# Patient Record
Sex: Female | Born: 1970 | Race: Black or African American | Hispanic: No
Health system: Southern US, Community
[De-identification: ages and names within clinical notes are randomized; demographics above are authoritative.]

---

## 2008-06-23 ENCOUNTER — Emergency Department: Payer: Self-pay | Admitting: Emergency Medicine

## 2011-05-08 ENCOUNTER — Ambulatory Visit: Payer: Self-pay | Admitting: Family Medicine

## 2011-06-10 ENCOUNTER — Emergency Department: Payer: Self-pay | Admitting: Emergency Medicine

## 2011-08-13 ENCOUNTER — Ambulatory Visit: Payer: Self-pay | Admitting: Family Medicine

## 2011-08-14 ENCOUNTER — Ambulatory Visit: Payer: Self-pay | Admitting: Family Medicine

## 2011-10-02 ENCOUNTER — Ambulatory Visit: Payer: Self-pay | Admitting: Family Medicine

## 2011-11-26 ENCOUNTER — Ambulatory Visit: Payer: Self-pay | Admitting: Gastroenterology

## 2011-11-27 LAB — PATHOLOGY REPORT

## 2011-12-19 ENCOUNTER — Inpatient Hospital Stay: Payer: Self-pay | Admitting: Psychiatry

## 2011-12-19 LAB — CBC
HCT: 40.6 % (ref 35.0–47.0)
HGB: 13.7 g/dL (ref 12.0–16.0)
MCH: 32.4 pg (ref 26.0–34.0)
MCHC: 33.7 g/dL (ref 32.0–36.0)
MCV: 96 fL (ref 80–100)
RBC: 4.22 10*6/uL (ref 3.80–5.20)
RDW: 12.8 % (ref 11.5–14.5)
WBC: 3.8 10*3/uL (ref 3.6–11.0)

## 2011-12-19 LAB — COMPREHENSIVE METABOLIC PANEL
Albumin: 3.4 g/dL (ref 3.4–5.0)
Alkaline Phosphatase: 61 U/L (ref 50–136)
BUN: 14 mg/dL (ref 7–18)
Bilirubin,Total: 0.4 mg/dL (ref 0.2–1.0)
Calcium, Total: 8.5 mg/dL (ref 8.5–10.1)
Chloride: 102 mmol/L (ref 98–107)
Co2: 28 mmol/L (ref 21–32)
Creatinine: 0.79 mg/dL (ref 0.60–1.30)
EGFR (Non-African Amer.): >60
Glucose: 140 mg/dL — ABNORMAL HIGH (ref 65–99)
Potassium: 4 mmol/L (ref 3.5–5.1)
SGPT (ALT): 17 U/L

## 2011-12-19 LAB — URINALYSIS, COMPLETE
Bacteria: NONE SEEN
Bilirubin,UR: NEGATIVE
Glucose,UR: NEGATIVE mg/dL (ref 0–75)
Ketone: NEGATIVE
Leukocyte Esterase: NEGATIVE
Nitrite: NEGATIVE
Ph: 6 (ref 4.5–8.0)
RBC,UR: 6 /HPF (ref 0–5)
Squamous Epithelial: 2
WBC UR: 1 /HPF (ref 0–5)

## 2011-12-19 LAB — DRUG SCREEN, URINE
Amphetamines, Ur Screen: NEGATIVE (ref ?–1000)
Barbiturates, Ur Screen: NEGATIVE (ref ?–200)
Benzodiazepine, Ur Scrn: NEGATIVE (ref ?–200)
Methadone, Ur Screen: NEGATIVE (ref ?–300)
Opiate, Ur Screen: POSITIVE (ref ?–300)
Phencyclidine (PCP) Ur S: NEGATIVE (ref ?–25)
Tricyclic, Ur Screen: NEGATIVE (ref ?–1000)

## 2011-12-19 LAB — MAGNESIUM: Magnesium: 1.8 mg/dL

## 2011-12-19 LAB — PREGNANCY, URINE: Pregnancy Test, Urine: NEGATIVE m[IU]/mL

## 2011-12-19 LAB — LIPASE, BLOOD: Lipase: 217 U/L (ref 73–393)

## 2013-06-03 IMAGING — CT CT HEAD WITHOUT CONTRAST
1 series · 16 of 29 positions shown, 20 images · non-contrast
Comparison: none

REASON FOR EXAM: CR  new onset of severe headache
COMMENTS:

PROCEDURE:     KCT - KCT HEAD WITHOUT CONTRAST  - May 08, 2011  [DATE]
RESULT:     Comparison:  None
TECHNIQUE: Multiple axial images from the foramen magnum to the vertex were
obtained without IV contrast.

[Series 2: 1 soft tissue · axial · 0.43mm/px · z∈[-150,-20]mm · 16 of 29 slices shown, 20 images]
[im 2/29  brain]
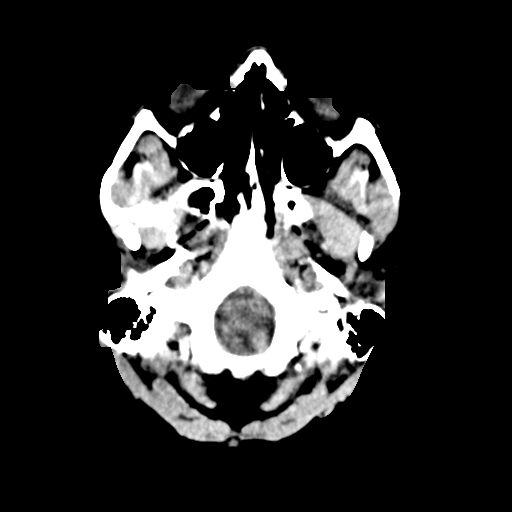
[im 2/29  bone]
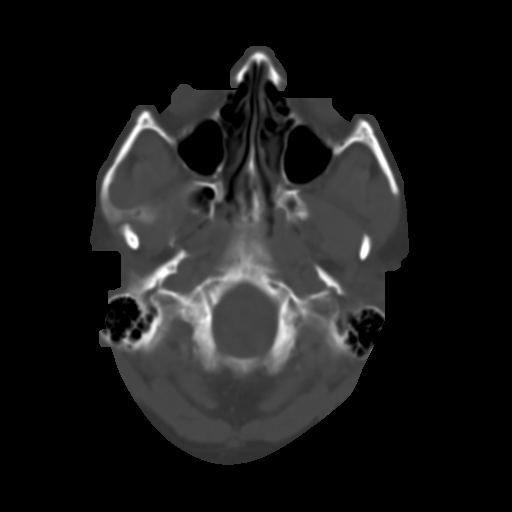
[im 4/29  brain]
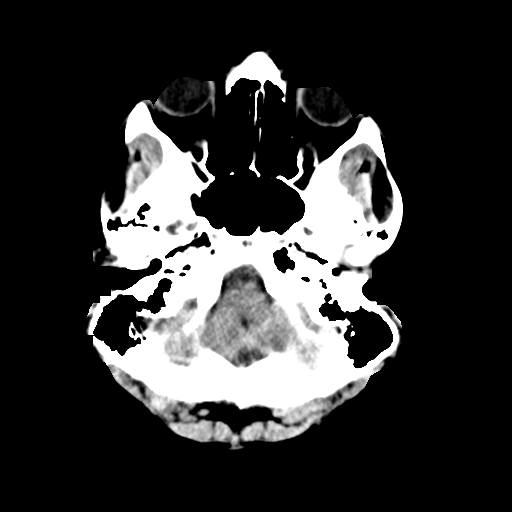
[im 6/29  brain]
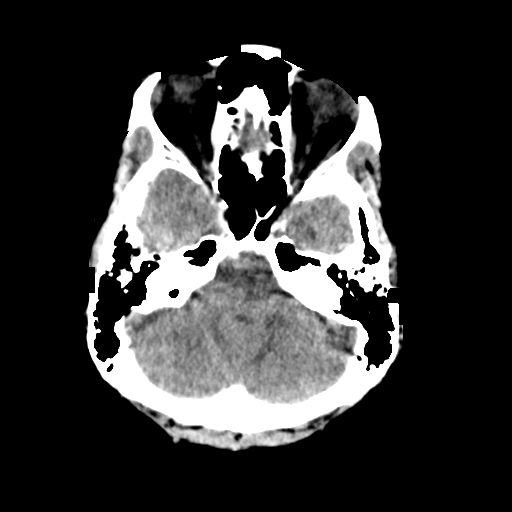
[im 7/29  brain]
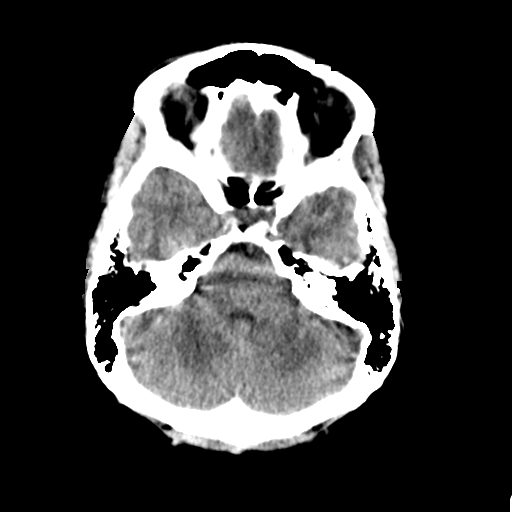
[im 9/29  brain]
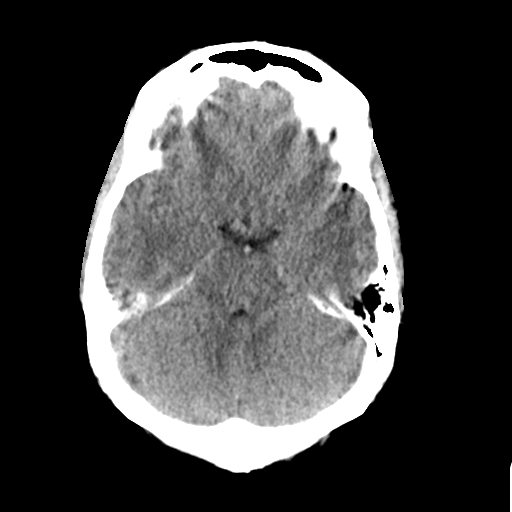
[im 9/29  bone]
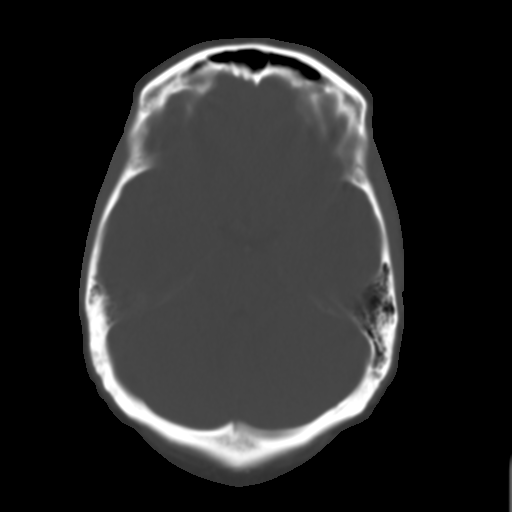
[im 11/29  brain]
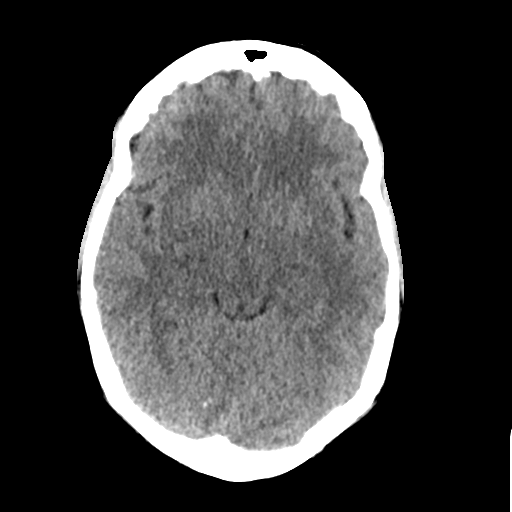
[im 12/29  brain]
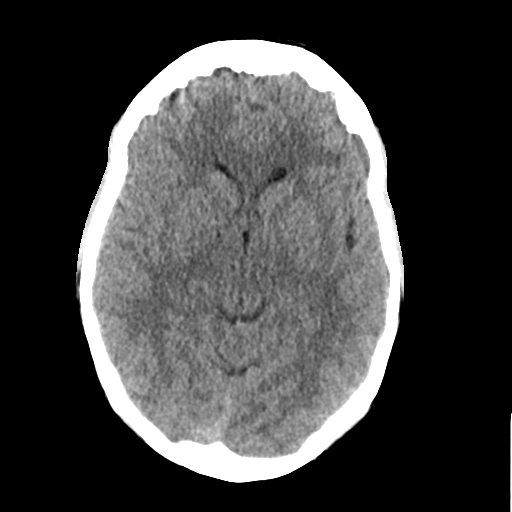
[im 14/29  brain]
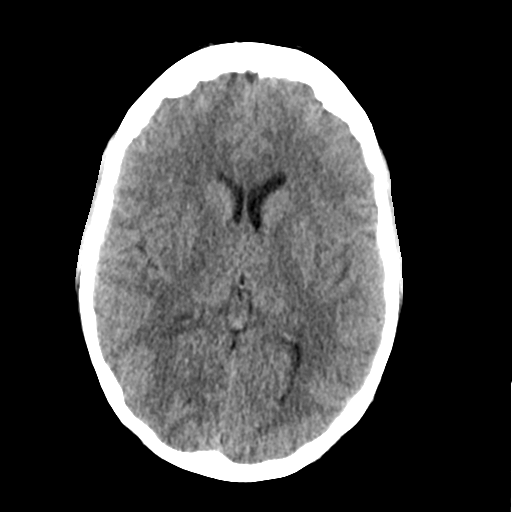
[im 16/29  brain]
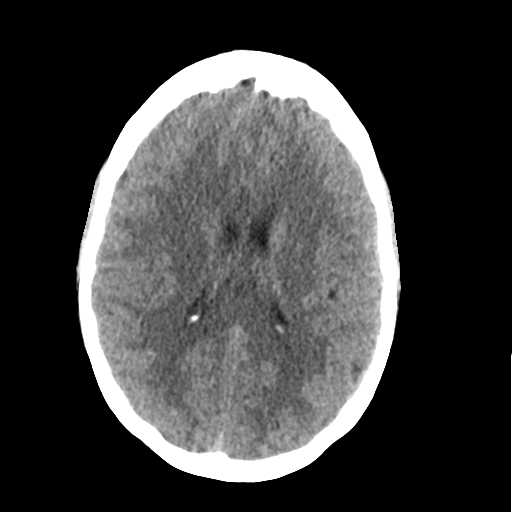
[im 16/29  bone]
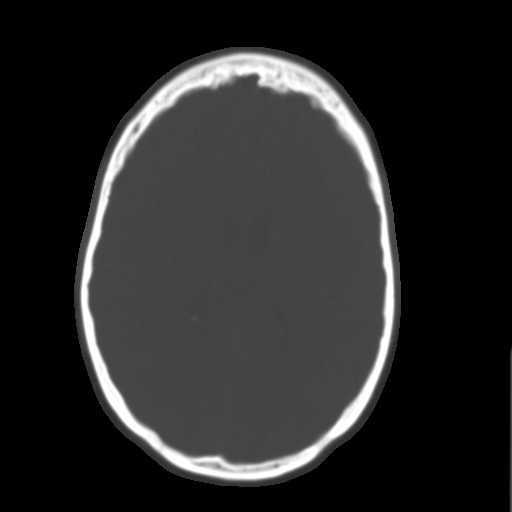
[im 18/29  brain]
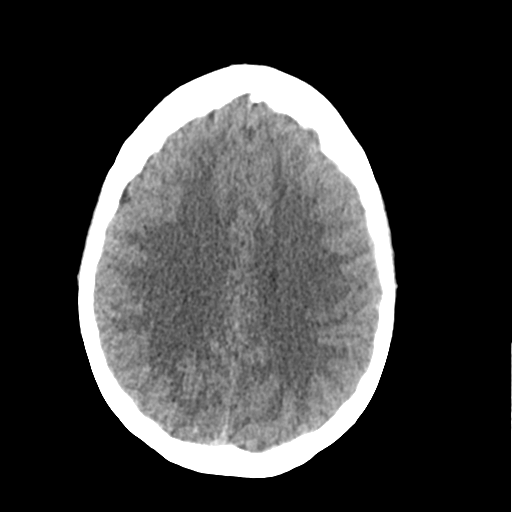
[im 19/29  brain]
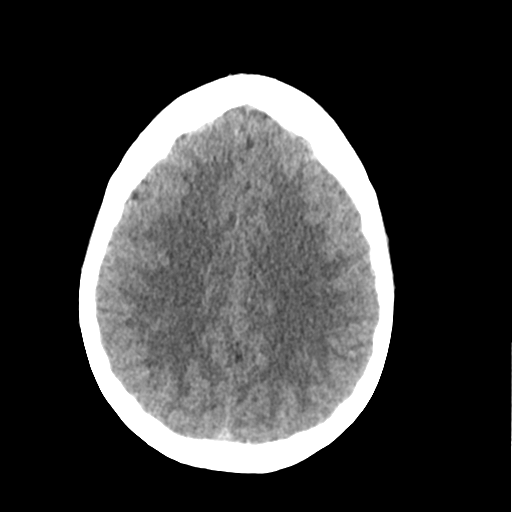
[im 21/29  brain]
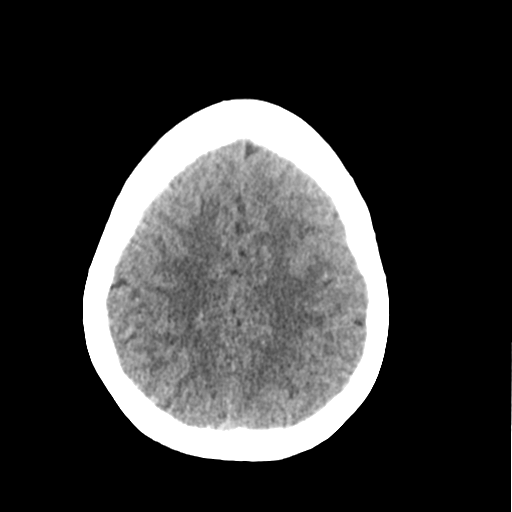
[im 23/29  brain]
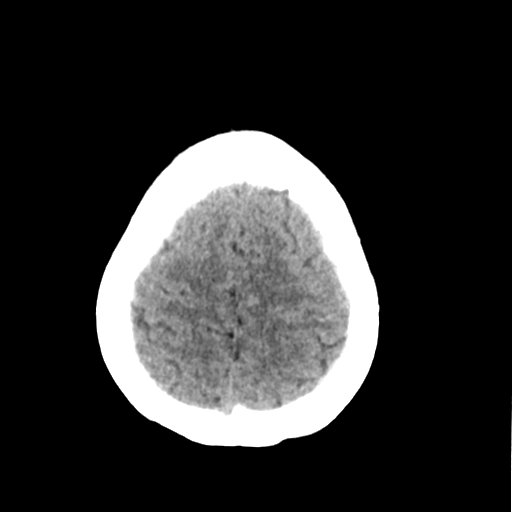
[im 23/29  bone]
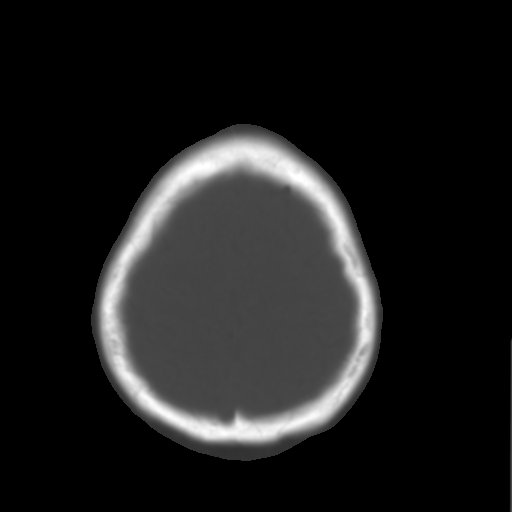
[im 24/29  brain]
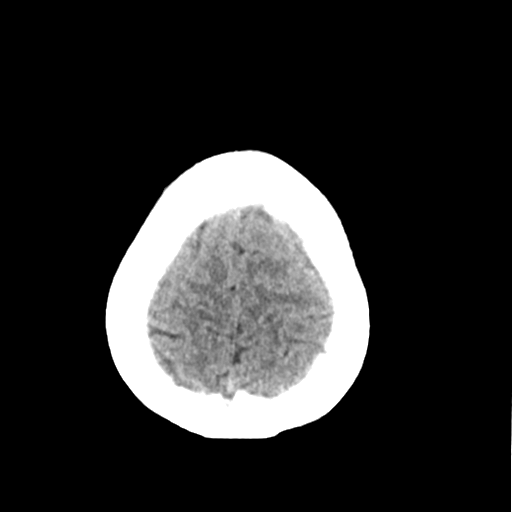
[im 26/29  brain]
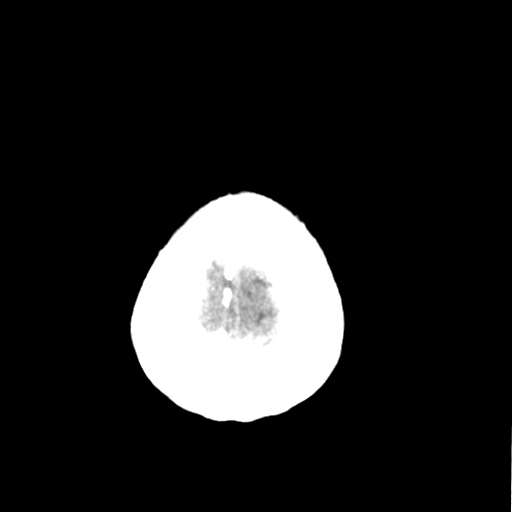
[im 28/29  brain]
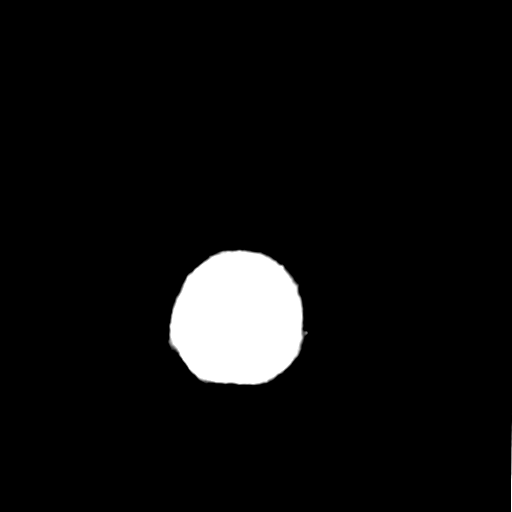

[16 of 29 positions shown; findings below may reference images not displayed]

FINDINGS: There is no evidence for mass effect, midline shift, or extra-axial fluid
collections. There is no evidence for space-occupying lesion, intracranial
hemorrhage, or cortical-based area of infarction.

The osseous structures are unremarkable.
IMPRESSION: No acute intracranial process.

It is continued clinical concern, further evaluation could be provided with
MRI.

## 2013-07-07 IMAGING — CR DG LUMBAR SPINE 2-3V
1 series · 4 of 4 positions shown · non-contrast
Comparison: none

REASON FOR EXAM: fall, lumbar spine point tenderness
COMMENTS:   LMP: btl

PROCEDURE:     DXR - DXR LUMBAR SPINE AP AND LATERAL  - June 11, 2011 [DATE]
RESULT:     Comparison is made to a prior exam of 06/23/2008. The vertebral
body heights and the intervertebral disc spaces are well maintained. The
vertebral body alignment is normal. The pedicles are bilaterally intact.

[Series 9: t lumbar spine ap · 0.14mm/px · 4 of 4 slices shown]
[im 1/4]
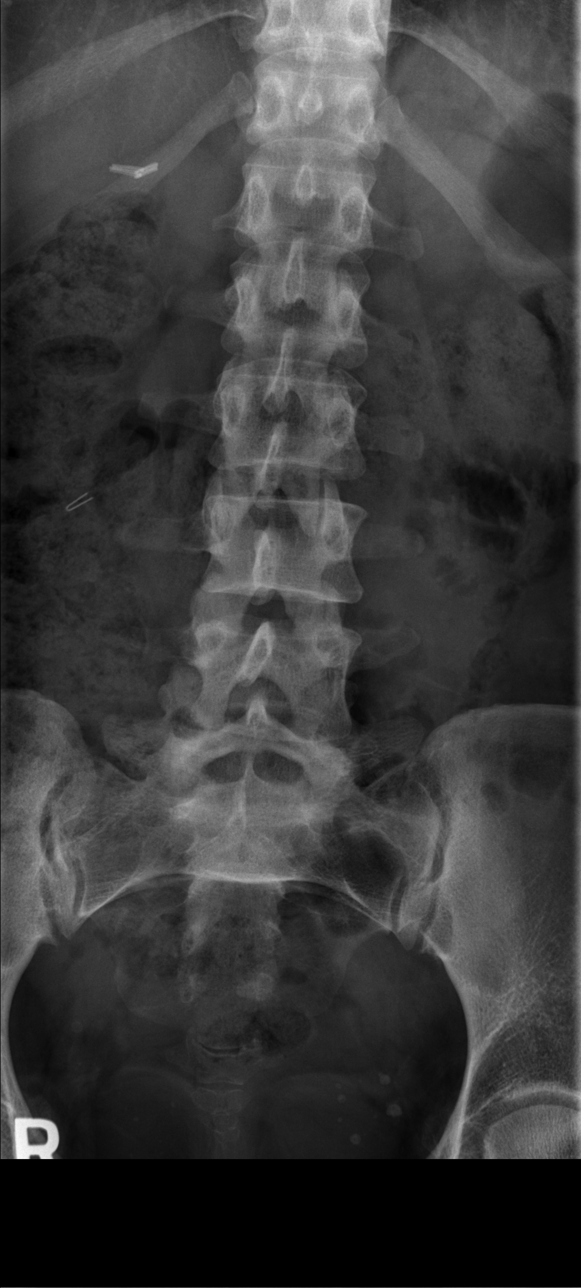
[im 2/4]
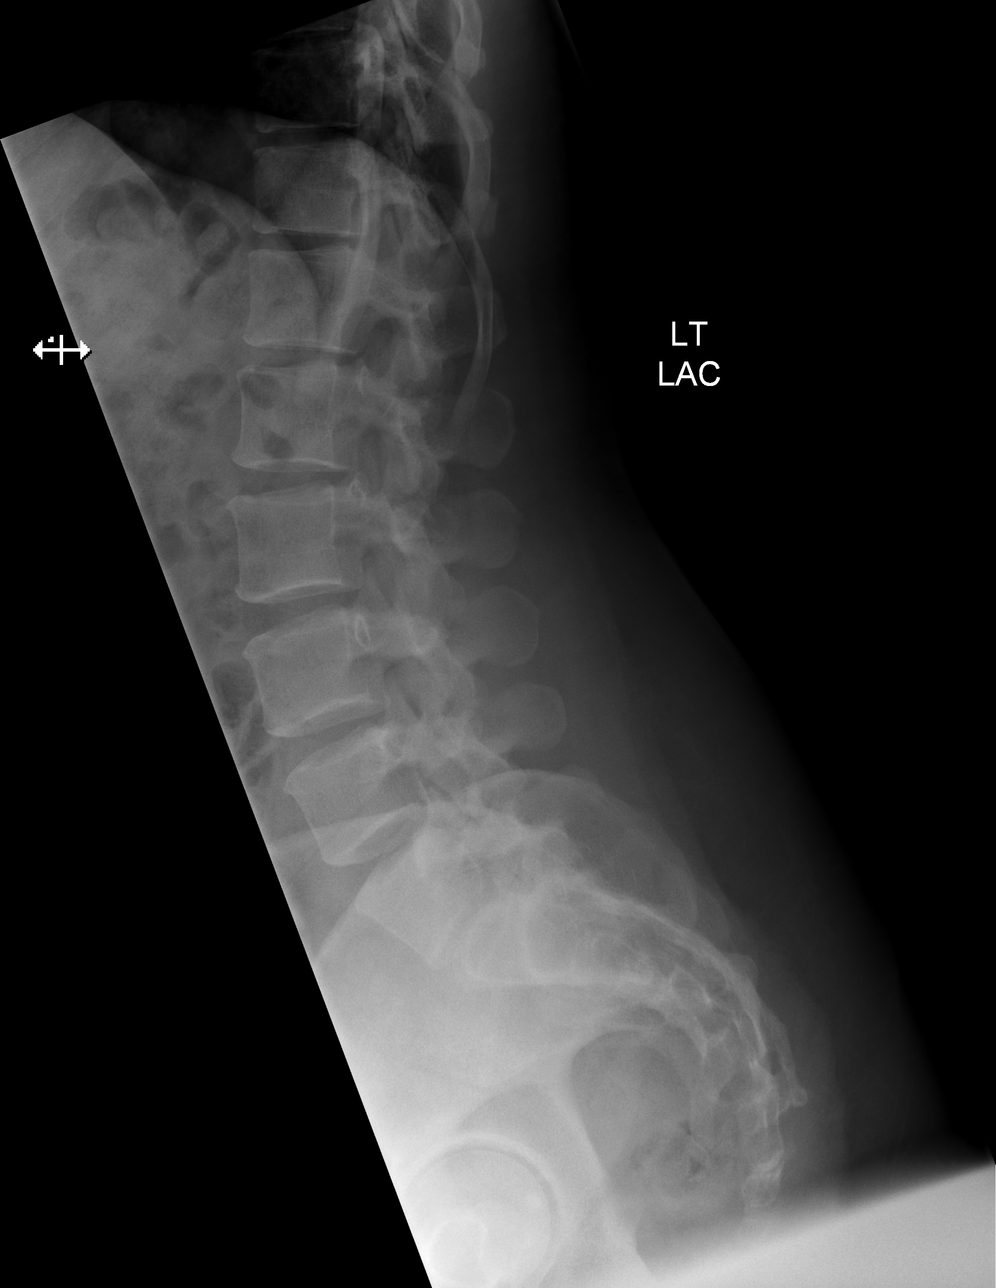
[im 3/4]
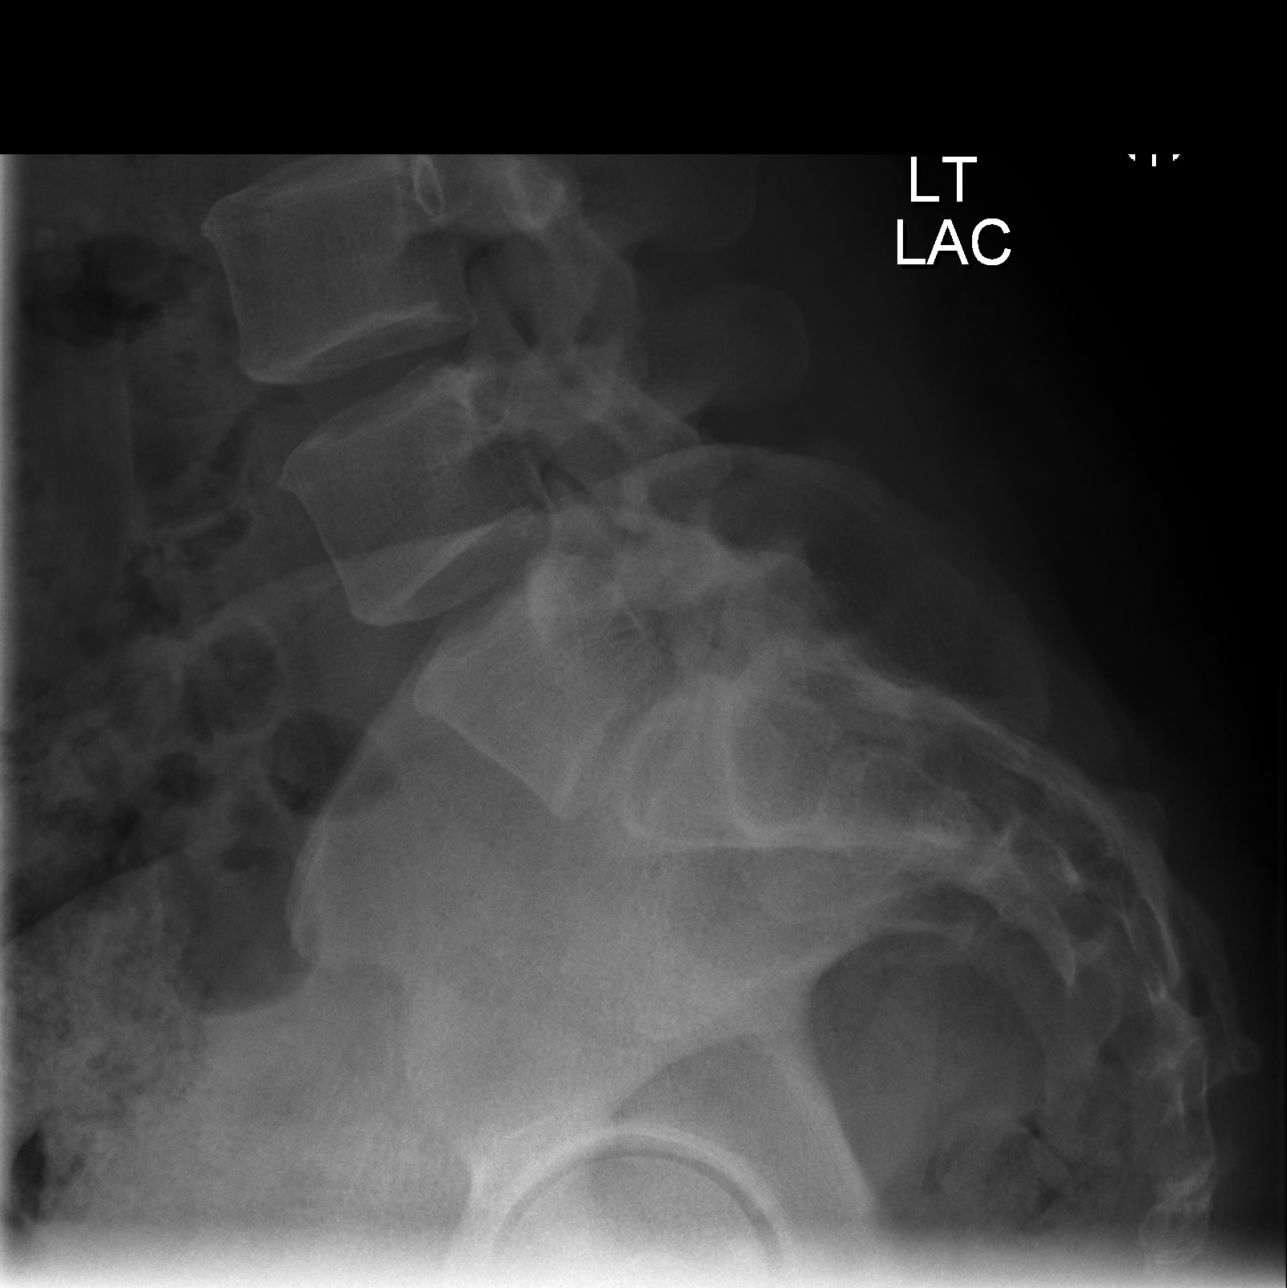
[im 4/4]
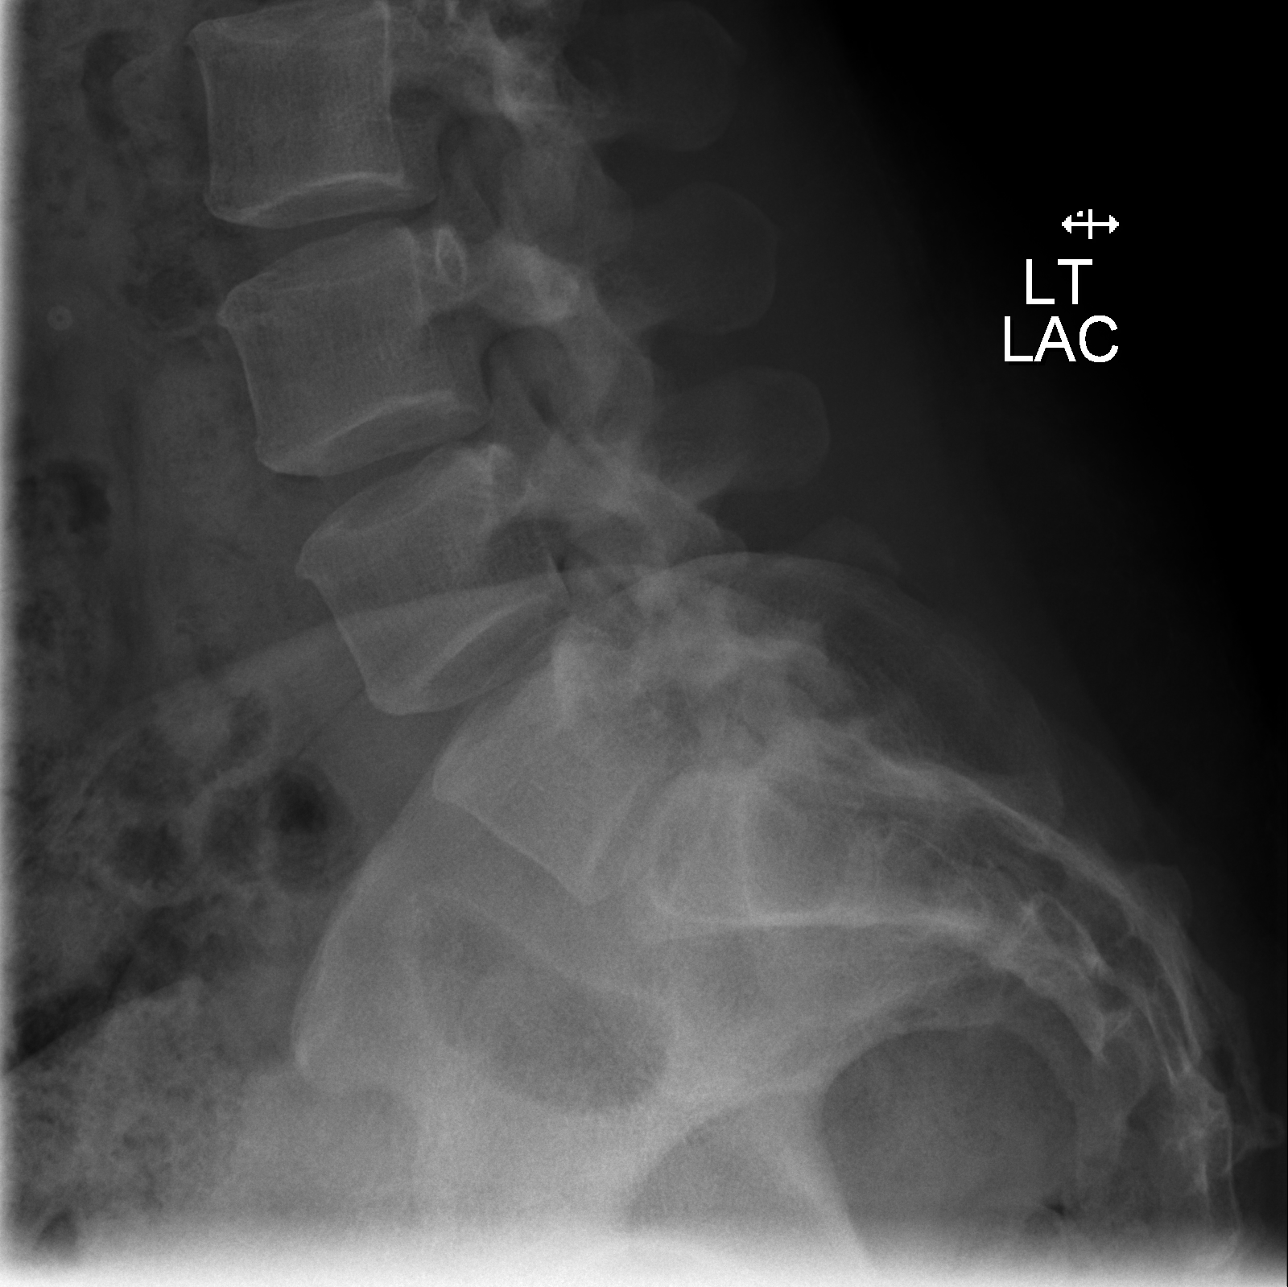

[4 of 4 positions shown; findings below may reference images not displayed]

IMPRESSION: 1.     No significant abnormalities are noted.
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
## 2014-11-19 NOTE — Discharge Summary (Signed)
PATIENT NAME:  Rachael Maddox, Rachael Maddox MR#:  [id number] DATE OF BIRTH:  1971/06/25  DATE OF ADMISSION:  12/19/2011 DATE OF DISCHARGE:  12/22/2011  HOSPITAL COURSE: See dictated history and physical for details of admission. This 44 year old woman was admitted through the Emergency Room after taking an overdose of Effexor which she had been prescribed as well as clonazepam by her primary care doctor. Patient described having had recent increase in agitation and confusion with some suicidal ideation. In the hospital she was not continued on either of these medicines. She was put on a low dose of Zoloft for treatment of her depression and anxiety. Patient at no point engaged in any dangerous or aggressive behavior. She denied any suicidal ideation. Initially she was very withdrawn and somewhat uncooperative. Over the next couple of days this improved quite a bit. By the time of discharge her affect is bright, she is interacting with other patient's and staff, she is taking care of herself well. She is able to articulate an appropriate plan for the future and denies any suicidal ideation whatsoever. Insight is improved. She agrees to follow-up treatment in the community and will be referred to either [facility] or else to another psychiatric provider of her choice.    LABORATORY, DIAGNOSTIC AND RADIOLOGICAL DATA: Admission tests show an opiate positive drug screen. She has consistently denied taking any pain medicine. The urinalysis did not appear to show infection. Pregnancy test negative. Chest x-ray unremarkable. Magnesium 1.8, lipase 217 both normal. CBC normal. Chemistry panel normal except for slightly elevated glucose at 140, which was a nonfasting test.   DISCHARGE MEDICATIONS:  1. Metformin 500 mg twice a day. 2. Zoloft 50 mg per day.   DISPOSITION: Discharged back to her own home.   FOLLOW UP: Follow up with private psychiatry in the community.   MENTAL STATUS EXAMINATION: Neatly dressed and groomed. Good eye  contact. Appropriate interaction. Psychomotor activity normal. Speech normal rate, tone, and volume. Affect bright, reactive, upbeat, appropriate. Mood stated as good. Thoughts appear lucid with no evidence of loosening of associations or delusional thinking. Denies hallucinations. Denies suicidal or homicidal ideation. Shows much improved judgment and insight.   DIAGNOSES PRINCIPLE AND PRIMARY:  AXIS I: Depression, not otherwise specified.   SECONDARY DIAGNOSES:  AXIS I: No further.   AXIS II: Deferred.   AXIS III: Diabetes, medication controlled.   AXIS IV: Severe from some social isolation, stress from work.   AXIS V: Functioning at time of discharge 60.  ____________________________ Audery AmelJohn T. Jarick Harkins, MD jtc:cms D: 12/22/2011 11:33:57 ET T: 12/23/2011 10:00:50 ET JOB#: [id number]  cc: Audery AmelJohn T. Moishy Laday, MD, <Dictator> Audery AmelJOHN T Aarya Quebedeaux MD ELECTRONICALLY SIGNED 12/23/2011 17:55
